# Patient Record
Sex: Female | Born: 1980 | Race: White | Hispanic: No | Marital: Married | Smoking: Never smoker
Health system: Southern US, Community
[De-identification: ages and names within clinical notes are randomized; demographics above are authoritative.]

## PROBLEM LIST (undated history)

## (undated) DIAGNOSIS — J302 Other seasonal allergic rhinitis: Secondary | ICD-10-CM

## (undated) DIAGNOSIS — E538 Deficiency of other specified B group vitamins: Secondary | ICD-10-CM

## (undated) HISTORY — DX: Deficiency of other specified B group vitamins: E53.8

## (undated) HISTORY — DX: Other seasonal allergic rhinitis: J30.2

## (undated) HISTORY — PX: TONGUE SURGERY: SHX810

---

## 2005-10-11 HISTORY — PX: TONSILLECTOMY AND ADENOIDECTOMY: SUR1326

## 2016-01-08 ENCOUNTER — Encounter: Payer: Self-pay | Admitting: Women's Health

## 2016-01-08 ENCOUNTER — Ambulatory Visit (INDEPENDENT_AMBULATORY_CARE_PROVIDER_SITE_OTHER): Payer: BC Managed Care – PPO | Admitting: Women's Health

## 2016-01-08 ENCOUNTER — Other Ambulatory Visit (HOSPITAL_COMMUNITY)
Admission: RE | Admit: 2016-01-08 | Discharge: 2016-01-08 | Disposition: A | Discharge status: Home / Self Care | Payer: BC Managed Care – PPO | Source: Ambulatory Visit | Attending: Obstetrics & Gynecology | Admitting: Obstetrics & Gynecology

## 2016-01-08 VITALS — BP 114/74 | HR 76 | Ht 68.5 in | Wt 259.0 lb

## 2016-01-08 DIAGNOSIS — R7303 Prediabetes: Secondary | ICD-10-CM

## 2016-01-08 DIAGNOSIS — G43009 Migraine without aura, not intractable, without status migrainosus: Secondary | ICD-10-CM | POA: Diagnosis not present

## 2016-01-08 DIAGNOSIS — Z01419 Encounter for gynecological examination (general) (routine) without abnormal findings: Secondary | ICD-10-CM | POA: Insufficient documentation

## 2016-01-08 DIAGNOSIS — Z01411 Encounter for gynecological examination (general) (routine) with abnormal findings: Secondary | ICD-10-CM

## 2016-01-08 DIAGNOSIS — E119 Type 2 diabetes mellitus without complications: Secondary | ICD-10-CM | POA: Insufficient documentation

## 2016-01-08 DIAGNOSIS — Z1151 Encounter for screening for human papillomavirus (HPV): Secondary | ICD-10-CM | POA: Insufficient documentation

## 2016-01-08 DIAGNOSIS — G43909 Migraine, unspecified, not intractable, without status migrainosus: Secondary | ICD-10-CM | POA: Insufficient documentation

## 2016-01-08 DIAGNOSIS — E669 Obesity, unspecified: Secondary | ICD-10-CM | POA: Insufficient documentation

## 2016-01-08 MED ORDER — NORETHIN ACE-ETH ESTRAD-FE 1.5-30 MG-MCG PO TABS: 1.0000 | ORAL_TABLET | Freq: Every day | ORAL | Status: DC

## 2016-01-08 NOTE — Patient Instructions (Signed)
Oral Contraception Use Oral contraceptive pills (OCPs) are medicines taken to prevent pregnancy. OCPs work by preventing the ovaries from releasing eggs. The hormones in OCPs also cause the cervical mucus to thicken, preventing the sperm from entering the uterus. The hormones also cause the uterine lining to become thin, not allowing a fertilized egg to attach to the inside of the uterus. OCPs are highly effective when taken exactly as prescribed. However, OCPs do not prevent sexually transmitted diseases (STDs). Safe sex practices, such as using condoms along with an OCP, can help prevent STDs. Before taking OCPs, you may have a physical exam and Pap test. Your health care provider may also order blood tests if necessary. Your health care provider will make sure you are a good candidate for oral contraception. Discuss with your health care provider the possible side effects of the OCP you may be prescribed. When starting an OCP, it can take 2 to 3 months for the body to adjust to the changes in hormone levels in your body.  HOW TO TAKE ORAL CONTRACEPTIVE PILLS Your health care provider may advise you on how to start taking the first cycle of OCPs. Otherwise, you can:   Start on day 1 of your menstrual period. You will not need any backup contraceptive protection with this start time.   Start on the first Sunday after your menstrual period or the day you get your prescription. In these cases, you will need to use backup contraceptive protection for the first week.   Start the pill at any time of your cycle. If you take the pill within 5 days of the start of your period, you are protected against pregnancy right away. In this case, you will not need a backup form of birth control. If you start at any other time of your menstrual cycle, you will need to use another form of birth control for 7 days. If your OCP is the type called a minipill, it will protect you from pregnancy after taking it for 2 days (48  hours). After you have started taking OCPs:   If you forget to take 1 pill, take it as soon as you remember. Take the next pill at the regular time.   If you miss 2 or more pills, call your health care provider because different pills have different instructions for missed doses. Use backup birth control until your next menstrual period starts.   If you use a 28-day pack that contains inactive pills and you miss 1 of the last 7 pills (pills with no hormones), it will not matter. Throw away the rest of the non-hormone pills and start a new pill pack.  No matter which day you start the OCP, you will always start a new pack on that same day of the week. Have an extra pack of OCPs and a backup contraceptive method available in case you miss some pills or lose your OCP pack.  HOME CARE INSTRUCTIONS   Do not smoke.   Always use a condom to protect against STDs. OCPs do not protect against STDs.   Use a calendar to mark your menstrual period days.   Read the information and directions that came with your OCP. Talk to your health care provider if you have questions.  SEEK MEDICAL CARE IF:   You develop nausea and vomiting.   You have abnormal vaginal discharge or bleeding.   You develop a rash.   You miss your menstrual period.   You are losing   your hair.   You need treatment for mood swings or depression.   You get dizzy when taking the OCP.   You develop acne from taking the OCP.   You become pregnant.  SEEK IMMEDIATE MEDICAL CARE IF:   You develop chest pain.   You develop shortness of breath.   You have an uncontrolled or severe headache.   You develop numbness or slurred speech.   You develop visual problems.   You develop pain, redness, and swelling in the legs.    This information is not intended to replace advice given to you by your health care provider. Make sure you discuss any questions you have with your health care provider.   Document  Released: 10/17/2011 Document Revised: 11/18/2014 Document Reviewed: 04/18/2013 Elsevier Interactive Patient Education 2016 Elsevier Inc.  

## 2016-01-08 NOTE — Progress Notes (Signed)
Patient ID: Natasha Flynn, female   DOB: 1981-02-11, 35 y.o.   MRN: [id number] Subjective:   Natasha Flynn is a 35 y.o. G0 Caucasian female here for a routine well-woman exam.  No LMP recorded. Patient is not currently having periods (Reason: Oral contraceptives).   Does continuous coc's x 3 months, then has period.  Current complaints: none PCP: Dr. Judie Grieve in [place]       Does not desire labs, done by PCP. Last A1C done w/in last pre-diabetic range, working w/ PCP to lose weight/lower A1C- has lost 25lbs Getting married in 18d, does not plan pregnancy anytime soon Needs refill on COCs. Does have migraines- takes topimax- no aura. Does not smoke, no h/o HTN, DVT/PE, CVA, MI, or migraines w/ aura.    Social History: Sexual: heterosexual Marital Status: engaged, getting married in [place]!!! 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  Past Medical History History reviewed. No pertinent past medical history.  Past Surgical History Past Surgical History  Procedure Laterality Date  . Tonsillectomy and adenoidectomy  10/2005  . Tongue surgery      Gynecologic History No obstetric history on file.  No LMP recorded. Patient is not currently having periods (Reason: Oral contraceptives). Contraception: OCP (estrogen/progesterone) Last Pap: 2013. Results were: normal Last mammogram: never. Results were: n/a Last TCS: never  Obstetric History OB History  No data available    Current Medications No current outpatient prescriptions on file prior to visit.   No current facility-administered medications on file prior to visit.    Review of  Systems Patient denies any headaches, blurred vision, shortness of breath, chest pain, abdominal pain, problems with bowel movements, urination, or intercourse.  Objective:  BP 114/74 mmHg  Pulse 76  Ht 5' 8.5" (1.74 m)  Wt 259 lb (117.482 kg)  BMI 38.80 kg/m2 Physical Exam  General:  Well developed, well nourished, no acute distress. She is alert and oriented x3. Skin:  Warm and dry Neck:  Midline trachea, no thyromegaly or nodules Cardiovascular: Regular rate and rhythm, no murmur heard Lungs:  Effort normal, all lung fields clear to auscultation bilaterally Breasts:  No dominant palpable mass, retraction, or nipple discharge Abdomen:  Soft, non tender, no hepatosplenomegaly or masses Pelvic:  External genitalia is normal in appearance.  The vagina is normal in appearance. The cervix is bulbous, no CMT.  Thin prep pap is done w/ HR HPV cotesting. Uterus is felt to be normal size, shape, and contour.  No adnexal masses or tenderness noted. Extremities:  No swelling or varicosities noted Psych:  She has a normal mood and affect  Assessment:   Healthy well-woman exam Pre-diabetes Obesity  Plan:  Continue working on weight loss Refilled COCs x 61yr F/U 58yr for physical, or sooner if needed Mammogram  or sooner if problems Colonoscopy  or sooner if problems  Marge Duncans CNM, [facility] 01/08/2016 4:47 PM

## 2016-01-10 LAB — CYTOLOGY - PAP

## 2016-07-04 ENCOUNTER — Other Ambulatory Visit: Payer: Self-pay | Admitting: Family Medicine

## 2016-07-04 DIAGNOSIS — M79672 Pain in left foot: Secondary | ICD-10-CM

## 2016-07-17 ENCOUNTER — Ambulatory Visit
Admission: RE | Admit: 2016-07-17 | Discharge: 2016-07-17 | Disposition: A | Discharge status: Home / Self Care | Payer: BC Managed Care – PPO | Source: Ambulatory Visit | Attending: Family Medicine | Admitting: Family Medicine

## 2016-07-17 DIAGNOSIS — M79672 Pain in left foot: Secondary | ICD-10-CM

## 2016-10-30 ENCOUNTER — Other Ambulatory Visit: Payer: Self-pay | Admitting: Women's Health

## 2016-11-06 ENCOUNTER — Other Ambulatory Visit: Payer: Self-pay | Admitting: *Deleted

## 2017-01-09 ENCOUNTER — Ambulatory Visit (INDEPENDENT_AMBULATORY_CARE_PROVIDER_SITE_OTHER): Payer: BC Managed Care – PPO | Admitting: Women's Health

## 2017-01-09 ENCOUNTER — Encounter: Payer: Self-pay | Admitting: Women's Health

## 2017-01-09 VITALS — BP 130/90 | HR 76 | Ht 67.0 in | Wt 269.8 lb

## 2017-01-09 DIAGNOSIS — Z01419 Encounter for gynecological examination (general) (routine) without abnormal findings: Secondary | ICD-10-CM

## 2017-01-09 DIAGNOSIS — Z6841 Body Mass Index (BMI) 40.0 and over, adult: Secondary | ICD-10-CM | POA: Diagnosis not present

## 2017-01-09 DIAGNOSIS — Z01411 Encounter for gynecological examination (general) (routine) with abnormal findings: Secondary | ICD-10-CM | POA: Diagnosis not present

## 2017-01-09 DIAGNOSIS — R030 Elevated blood-pressure reading, without diagnosis of hypertension: Secondary | ICD-10-CM

## 2017-01-09 DIAGNOSIS — E669 Obesity, unspecified: Secondary | ICD-10-CM | POA: Diagnosis not present

## 2017-01-09 NOTE — Patient Instructions (Signed)
Keep appointment with your doctor, talk to him about your blood pressure and ask about yearly labs  130/90, 140/100  Come back in a week for a blood pressure check with our nurse

## 2017-01-09 NOTE — Progress Notes (Signed)
Subjective:   Natasha Flynn is a 36 y.o. Caucasian female here for a routine well-woman exam.  Patient's last menstrual period was 11/18/2016.    Current complaints: seeing pcp tomorrow for recurrent cough, hasn't felt well since Nov PCP: Dr. Quillian Quince in [place]       Does not desire labs, will do w/ PCP Needs refills on coc's Denies h/o HTN, states she felt like it would be high today  Social History: Sexual: heterosexual Marital Status: married Living situation: w/ husband Occupation: Pharmacist, hospital at [employer] Tobacco/alcohol: no tobacco, etoh: occ Illicit drugs: no history of illicit drug use  The following portions of the patient's history were reviewed and updated as appropriate: allergies, current medications, past family history, past medical history, past social history, past surgical history and problem list.  Past Medical History Past Medical History:  Diagnosis Date  . Seasonal allergies     Past Surgical History Past Surgical History:  Procedure Laterality Date  . TONGUE SURGERY    . TONSILLECTOMY AND ADENOIDECTOMY  10/2005    Gynecologic History No obstetric history on file.  Patient's last menstrual period was 11/18/2016. Contraception: OCP (estrogen/progesterone) Last Pap: 2017. Results were: normal Last mammogram: never. Results were: n/a Last TCS: never  Obstetric History OB History  No data available    Current Medications Current Outpatient Prescriptions on File Prior to Visit  Medication Sig Dispense Refill  . Alcaftadine (LASTACAFT) 0.25 % SOLN Apply to eye.    Marland Kitchen BLISOVI FE 1.5/30 1.5-30 MG-MCG tablet TAKE 1 TABLET BY MOUTH EVERY DAY 28 tablet 2  . Cyanocobalamin 1000 MCG/ML KIT Inject as directed every 30 (thirty) days.    . cyanocobalamin 500 MCG tablet Take 500 mcg by mouth daily.    . montelukast (SINGULAIR) 10 MG tablet Take 10 mg by mouth at bedtime.    . topiramate (TOPAMAX) 25 MG capsule Take 25 mg by mouth 2 (two) times daily.     No current  facility-administered medications on file prior to visit.     Review of Systems Patient denies any headaches, blurred vision, shortness of breath, chest pain, abdominal pain, problems with bowel movements, urination, or intercourse.  Objective:  BP 130/90   Pulse 76   Ht 5' 7"  (1.702 m)   Wt 269 lb 12.8 oz (122.4 kg)   LMP 11/18/2016   BMI 42.26 kg/m   BP recheck 140/100 Physical Exam  General:  Well developed, well nourished, no acute distress. She is alert and oriented x3. Skin:  Warm and dry Neck:  Midline trachea, no thyromegaly or nodules Cardiovascular: Regular rate and rhythm, no murmur heard Lungs:  Effort normal, all lung fields clear to auscultation bilaterally Breasts:  No dominant palpable mass, retraction, or nipple discharge Abdomen:  Soft, non tender, no hepatosplenomegaly or masses Pelvic:  External genitalia is normal in appearance.  The vagina is normal in appearance. The cervix is bulbous, no CMT.  Thin prep pap is not done . Uterus is felt to be normal size, shape, and contour.  No adnexal masses or tenderness noted. Unable to adequately assess d/t body habitus Extremities:  No swelling or varicosities noted Psych:  She has a normal mood and affect  Assessment:   Healthy well-woman exam Elevated bp Persistent cough Obesity Contraception management  Plan:  Discussed can't refill coc today d/t bp, can return in 1wk for bp check, if normal will rx then, discussed progesin only methods, none of which she wants F/U 1wk for bp check w/ RN,  or sooner if needed Mammogram @36yo  or sooner if problems Colonoscopy @36yo  or sooner if problems Keep appt w/ pcp tomorrow for persistent cough, discuss bp (gave 2 bp's here today to take w/ her), discuss yearly labs  Tawnya Crook CNM, [facility] 01/09/2017 3:40 PM

## 2017-01-16 ENCOUNTER — Other Ambulatory Visit: Payer: Self-pay | Admitting: Women's Health

## 2017-01-16 ENCOUNTER — Ambulatory Visit (INDEPENDENT_AMBULATORY_CARE_PROVIDER_SITE_OTHER): Payer: BC Managed Care – PPO | Admitting: *Deleted

## 2017-01-16 VITALS — BP 126/86

## 2017-01-16 DIAGNOSIS — Z308 Encounter for other contraceptive management: Secondary | ICD-10-CM

## 2017-01-16 DIAGNOSIS — Z0131 Encounter for examination of blood pressure with abnormal findings: Secondary | ICD-10-CM | POA: Diagnosis not present

## 2017-01-16 MED ORDER — NORETHIN ACE-ETH ESTRAD-FE 1.5-30 MG-MCG PO TABS: 1.0000 | ORAL_TABLET | Freq: Every day | ORAL | 11 refills | Status: DC

## 2017-01-16 NOTE — Progress Notes (Signed)
BP check w/ RN good today, 126/86, will refill coc's x 276yr.  Cheral MarkerKimberly R. Dorsey Authement, CNM, [facility] 01/16/2017 5:09 PM

## 2017-01-16 NOTE — Progress Notes (Signed)
Pt here for BP check. BP 126/86. Pt with no complaints.

## 2017-12-16 ENCOUNTER — Other Ambulatory Visit: Payer: Self-pay | Admitting: Women's Health

## 2018-08-06 ENCOUNTER — Encounter: Payer: Self-pay | Admitting: Adult Health

## 2018-08-06 ENCOUNTER — Ambulatory Visit: Payer: BC Managed Care – PPO | Admitting: Adult Health

## 2018-08-06 ENCOUNTER — Encounter (INDEPENDENT_AMBULATORY_CARE_PROVIDER_SITE_OTHER): Payer: Self-pay

## 2018-08-06 VITALS — BP 120/82 | HR 109 | Ht 70.0 in | Wt 261.0 lb

## 2018-08-06 DIAGNOSIS — Z3A01 Less than 8 weeks gestation of pregnancy: Secondary | ICD-10-CM

## 2018-08-06 DIAGNOSIS — Z3201 Encounter for pregnancy test, result positive: Secondary | ICD-10-CM | POA: Diagnosis not present

## 2018-08-06 DIAGNOSIS — N926 Irregular menstruation, unspecified: Secondary | ICD-10-CM | POA: Diagnosis not present

## 2018-08-06 DIAGNOSIS — O3680X0 Pregnancy with inconclusive fetal viability, not applicable or unspecified: Secondary | ICD-10-CM | POA: Insufficient documentation

## 2018-08-06 DIAGNOSIS — O09511 Supervision of elderly primigravida, first trimester: Secondary | ICD-10-CM | POA: Insufficient documentation

## 2018-08-06 LAB — POCT URINE PREGNANCY: PREG TEST UR: POSITIVE — AB

## 2018-08-06 NOTE — Patient Instructions (Signed)
First Trimester of Pregnancy The first trimester of pregnancy is from week 1 until the end of week 13 (months 1 through 3). A week after a sperm fertilizes an egg, the egg will implant on the wall of the uterus. This embryo will begin to develop into a baby. Genes from you and your partner will form the baby. The female genes will determine whether the baby will be a boy or a girl. At 6-8 weeks, the eyes and face will be formed, and the heartbeat can be seen on ultrasound. At the end of 12 weeks, all the baby's organs will be formed. Now that you are pregnant, you will want to do everything you can to have a healthy baby. Two of the most important things are to get good prenatal care and to follow your health care provider's instructions. Prenatal care is all the medical care you receive before the baby's birth. This care will help prevent, find, and treat any problems during the pregnancy and childbirth. Body changes during your first trimester Your body goes through many changes during pregnancy. The changes vary from woman to woman.  You may gain or lose a couple of pounds at first.  You may feel sick to your stomach (nauseous) and you may throw up (vomit). If the vomiting is uncontrollable, call your health care provider.  You may tire easily.  You may develop headaches that can be relieved by medicines. All medicines should be approved by your health care provider.  You may urinate more often. Painful urination may mean you have a bladder infection.  You may develop heartburn as a result of your pregnancy.  You may develop constipation because certain hormones are causing the muscles that push stool through your intestines to slow down.  You may develop hemorrhoids or swollen veins (varicose veins).  Your breasts may begin to grow larger and become tender. Your nipples may stick out more, and the tissue that surrounds them (areola) may become darker.  Your gums may bleed and may be  sensitive to brushing and flossing.  Dark spots or blotches (chloasma, mask of pregnancy) may develop on your face. This will likely fade after the baby is born.  Your menstrual periods will stop.  You may have a loss of appetite.  You may develop cravings for certain kinds of food.  You may have changes in your emotions from day to day, such as being excited to be pregnant or being concerned that something may go wrong with the pregnancy and baby.  You may have more vivid and strange dreams.  You may have changes in your hair. These can include thickening of your hair, rapid growth, and changes in texture. Some women also have hair loss during or after pregnancy, or hair that feels dry or thin. Your hair will most likely return to normal after your baby is born.  What to expect at prenatal visits During a routine prenatal visit:  You will be weighed to make sure you and the baby are growing normally.  Your blood pressure will be taken.  Your abdomen will be measured to track your baby's growth.  The fetal heartbeat will be listened to between weeks 10 and 14 of your pregnancy.  Test results from any previous visits will be discussed.  Your health care provider may ask you:  How you are feeling.  If you are feeling the baby move.  If you have had any abnormal symptoms, such as leaking fluid, bleeding, severe headaches,   or abdominal cramping.  If you are using any tobacco products, including cigarettes, chewing tobacco, and electronic cigarettes.  If you have any questions.  Other tests that may be performed during your first trimester include:  Blood tests to find your blood type and to check for the presence of any previous infections. The tests will also be used to check for low iron levels (anemia) and protein on red blood cells (Rh antibodies). Depending on your risk factors, or if you previously had diabetes during pregnancy, you may have tests to check for high blood  sugar that affects pregnant women (gestational diabetes).  Urine tests to check for infections, diabetes, or protein in the urine.  An ultrasound to confirm the proper growth and development of the baby.  Fetal screens for spinal cord problems (spina bifida) and Down syndrome.  HIV (human immunodeficiency virus) testing. Routine prenatal testing includes screening for HIV, unless you choose not to have this test.  You may need other tests to make sure you and the baby are doing well.  Follow these instructions at home: Medicines  Follow your health care provider's instructions regarding medicine use. Specific medicines may be either safe or unsafe to take during pregnancy.  Take a prenatal vitamin that contains at least 600 micrograms (mcg) of folic acid.  If you develop constipation, try taking a stool softener if your health care provider approves. Eating and drinking  Eat a balanced diet that includes fresh fruits and vegetables, whole grains, good sources of protein such as meat, eggs, or tofu, and low-fat dairy. Your health care provider will help you determine the amount of weight gain that is right for you.  Avoid raw meat and uncooked cheese. These carry germs that can cause birth defects in the baby.  Eating four or five small meals rather than three large meals a day may help relieve nausea and vomiting. If you start to feel nauseous, eating a few soda crackers can be helpful. Drinking liquids between meals, instead of during meals, also seems to help ease nausea and vomiting.  Limit foods that are high in fat and processed sugars, such as fried and sweet foods.  To prevent constipation: ? Eat foods that are high in fiber, such as fresh fruits and vegetables, whole grains, and beans. ? Drink enough fluid to keep your urine clear or pale yellow. Activity  Exercise only as directed by your health care provider. Most women can continue their usual exercise routine during  pregnancy. Try to exercise for 30 minutes at least 5 days a week. Exercising will help you: ? Control your weight. ? Stay in shape. ? Be prepared for labor and delivery.  Experiencing pain or cramping in the lower abdomen or lower back is a good sign that you should stop exercising. Check with your health care provider before continuing with normal exercises.  Try to avoid standing for long periods of time. Move your legs often if you must stand in one place for a long time.  Avoid heavy lifting.  Wear low-heeled shoes and practice good posture.  You may continue to have sex unless your health care provider tells you not to. Relieving pain and discomfort  Wear a good support bra to relieve breast tenderness.  Take warm sitz baths to soothe any pain or discomfort caused by hemorrhoids. Use hemorrhoid cream if your health care provider approves.  Rest with your legs elevated if you have leg cramps or low back pain.  If you develop   varicose veins in your legs, wear support hose. Elevate your feet for 15 minutes, 3-4 times a day. Limit salt in your diet. Prenatal care  Schedule your prenatal visits by the twelfth week of pregnancy. They are usually scheduled monthly at first, then more often in the last 2 months before delivery.  Write down your questions. Take them to your prenatal visits.  Keep all your prenatal visits as told by your health care provider. This is important. Safety  Wear your seat belt at all times when driving.  Make a list of emergency phone numbers, including numbers for family, friends, the hospital, and police and fire departments. General instructions  Ask your health care provider for a referral to a local prenatal education class. Begin classes no later than the beginning of month 6 of your pregnancy.  Ask for help if you have counseling or nutritional needs during pregnancy. Your health care provider can offer advice or refer you to specialists for help  with various needs.  Do not use hot tubs, steam rooms, or saunas.  Do not douche or use tampons or scented sanitary pads.  Do not cross your legs for long periods of time.  Avoid cat litter boxes and soil used by cats. These carry germs that can cause birth defects in the baby and possibly loss of the fetus by miscarriage or stillbirth.  Avoid all smoking, herbs, alcohol, and medicines not prescribed by your health care provider. Chemicals in these products affect the formation and growth of the baby.  Do not use any products that contain nicotine or tobacco, such as cigarettes and e-cigarettes. If you need help quitting, ask your health care provider. You may receive counseling support and other resources to help you quit.  Schedule a dentist appointment. At home, brush your teeth with a soft toothbrush and be gentle when you floss. Contact a health care provider if:  You have dizziness.  You have mild pelvic cramps, pelvic pressure, or nagging pain in the abdominal area.  You have persistent nausea, vomiting, or diarrhea.  You have a bad smelling vaginal discharge.  You have pain when you urinate.  You notice increased swelling in your face, hands, legs, or ankles.  You are exposed to fifth disease or chickenpox.  You are exposed to German measles (rubella) and have never had it. Get help right away if:  You have a fever.  You are leaking fluid from your vagina.  You have spotting or bleeding from your vagina.  You have severe abdominal cramping or pain.  You have rapid weight gain or loss.  You vomit blood or material that looks like coffee grounds.  You develop a severe headache.  You have shortness of breath.  You have any kind of trauma, such as from a fall or a car accident. Summary  The first trimester of pregnancy is from week 1 until the end of week 13 (months 1 through 3).  Your body goes through many changes during pregnancy. The changes vary from  woman to woman.  You will have routine prenatal visits. During those visits, your health care provider will examine you, discuss any test results you may have, and talk with you about how you are feeling. This information is not intended to replace advice given to you by your health care provider. Make sure you discuss any questions you have with your health care provider. Document Released: 10/22/2001 Document Revised: 10/09/2016 Document Reviewed: 10/09/2016 Elsevier Interactive Patient Education  2018 Elsevier   Inc.  

## 2018-08-06 NOTE — Progress Notes (Signed)
  Subjective:     Patient ID: Natasha Flynn, female   DOB: 05-10-1981, 37 y.o.   MRN: [id number]  HPI Natasha Flynn is a 37 year old white female, married in for UPT, has missed a period and had +HPT x 2.  Review of Systems +missed period, with +HPT x 2  +cramps at times Reviewed past medical,surgical, social and family history. Reviewed medications and allergies.     Objective:   Physical Exam BP 120/82 (BP Location: Right Arm, Patient Position: Sitting, Cuff Size: Normal)   Pulse (!) 109   Ht 5\' 10"  (1.778 m)   Wt 261 lb (118.4 kg)   LMP 06/20/2018 (Exact Date)   BMI 37.45 kg/m UPT +,a bout 6+5 weeks by LMP with EDD 03/27/19.Skin warm and dry. Neck: mid line trachea, normal thyroid, good ROM, no lymphadenopathy noted. Lungs: clear to ausculation bilaterally. Cardiovascular: regular rate and rhythm.abdomen is soft and non tender. PHQ 2 score 0.    Assessment:     1. Pregnancy test positive   2. Less than [redacted] weeks gestation of pregnancy   3. Encounter to determine fetal viability of pregnancy, single or unspecified fetus   4. AMA (advanced maternal age) primigravida 37+, first trimester       Plan:     Continue PNV OK to take B12 Dating [place] 10/4 at [facility] at 10:30 am Review handout on First trimester and by [facility]

## 2018-08-14 ENCOUNTER — Ambulatory Visit (HOSPITAL_COMMUNITY)
Admission: RE | Admit: 2018-08-14 | Discharge: 2018-08-14 | Disposition: A | Discharge status: Home / Self Care | Payer: BC Managed Care – PPO | Source: Ambulatory Visit | Attending: Adult Health | Admitting: Adult Health

## 2018-08-14 DIAGNOSIS — O3680X0 Pregnancy with inconclusive fetal viability, not applicable or unspecified: Secondary | ICD-10-CM | POA: Diagnosis present

## 2018-08-14 DIAGNOSIS — Z3A01 Less than 8 weeks gestation of pregnancy: Secondary | ICD-10-CM | POA: Diagnosis not present

## 2018-08-28 ENCOUNTER — Encounter: Payer: Self-pay | Admitting: Women's Health

## 2018-08-28 ENCOUNTER — Ambulatory Visit (INDEPENDENT_AMBULATORY_CARE_PROVIDER_SITE_OTHER): Payer: BC Managed Care – PPO | Admitting: Women's Health

## 2018-08-28 ENCOUNTER — Ambulatory Visit: Payer: BC Managed Care – PPO | Admitting: *Deleted

## 2018-08-28 VITALS — BP 125/79 | HR 76 | Wt 259.0 lb

## 2018-08-28 DIAGNOSIS — Z1389 Encounter for screening for other disorder: Secondary | ICD-10-CM | POA: Diagnosis not present

## 2018-08-28 DIAGNOSIS — L292 Pruritus vulvae: Secondary | ICD-10-CM

## 2018-08-28 DIAGNOSIS — O9989 Other specified diseases and conditions complicating pregnancy, childbirth and the puerperium: Secondary | ICD-10-CM

## 2018-08-28 DIAGNOSIS — O0990 Supervision of high risk pregnancy, unspecified, unspecified trimester: Secondary | ICD-10-CM | POA: Insufficient documentation

## 2018-08-28 DIAGNOSIS — Z3682 Encounter for antenatal screening for nuchal translucency: Secondary | ICD-10-CM

## 2018-08-28 DIAGNOSIS — E559 Vitamin D deficiency, unspecified: Secondary | ICD-10-CM | POA: Insufficient documentation

## 2018-08-28 DIAGNOSIS — Z3A08 8 weeks gestation of pregnancy: Secondary | ICD-10-CM | POA: Diagnosis not present

## 2018-08-28 DIAGNOSIS — Z331 Pregnant state, incidental: Secondary | ICD-10-CM | POA: Diagnosis not present

## 2018-08-28 DIAGNOSIS — Z3401 Encounter for supervision of normal first pregnancy, first trimester: Secondary | ICD-10-CM

## 2018-08-28 LAB — POCT URINALYSIS DIPSTICK OB
LEUKOCYTES UA: NEGATIVE
NITRITE UA: NEGATIVE
PROTEIN: NEGATIVE
RBC UA: NEGATIVE

## 2018-08-28 LAB — POCT WET PREP (WET MOUNT)
Clue Cells Wet Prep Whiff POC: NEGATIVE
TRICHOMONAS WET PREP HPF POC: ABSENT

## 2018-08-28 NOTE — Patient Instructions (Addendum)
Natasha Flynn, I greatly value your feedback.  If you receive a survey following your visit with [place] today, we appreciate you taking the time to fill it out.  Thanks, Joellyn Haff, CNM, WHNP-BC  You will have your sugar test next visit.  Please do not eat or drink anything after midnight the night before you come, not even water.  You will be here for at least two hours.      Nausea & Vomiting  Have saltine crackers or pretzels by your bed and eat a few bites before you raise your head out of bed in the morning  Eat small frequent meals throughout the day instead of large meals  Drink plenty of fluids throughout the day to stay hydrated, just don't drink a lot of fluids with your meals.  This can make your stomach fill up faster making you feel sick  Do not brush your teeth right after you eat  Products with real ginger are good for nausea, like ginger ale and ginger hard candy Make sure it says made with real ginger!  Sucking on sour candy like lemon heads is also good for nausea  If your prenatal vitamins make you nauseated, take them at night so you will sleep through the nausea  Sea Bands  If you feel like you need medicine for the nausea & vomiting please let us know  If you are unable to keep any fluids or food down please let us know   Constipation  Drink plenty of fluid, preferably water, throughout the day  Eat foods high in fiber such as fruits, vegetables, and grains  Exercise, such as walking, is a good way to keep your bowels regular  Drink warm fluids, especially warm prune juice, or decaf coffee  Eat a 1/2 cup of real oatmeal (not instant), 1/2 cup applesauce, and 1/2-1 cup warm prune juice every day  If needed, you may take Colace (docusate sodium) stool softener once or twice a day to help keep the stool soft. If you are pregnant, wait until you are out of your first trimester (12-14 weeks of pregnancy)  If you still are having problems with constipation, you  may take Miralax once daily as needed to help keep your bowels regular.  If you are pregnant, wait until you are out of your first trimester (12-14 weeks of pregnancy)   First Trimester of Pregnancy The first trimester of pregnancy is from week 1 until the end of week 12 (months 1 through 3). A week after a sperm fertilizes an egg, the egg will implant on the wall of the uterus. This embryo will begin to develop into a baby. Genes from you and your partner are forming the baby. The female genes determine whether the baby is a boy or a girl. At 6-8 weeks, the eyes and face are formed, and the heartbeat can be seen on ultrasound. At the end of 12 weeks, all the baby's organs are formed.  Now that you are pregnant, you will want to do everything you can to have a healthy baby. Two of the most important things are to get good prenatal care and to follow your health care provider's instructions. Prenatal care is all the medical care you receive before the baby's birth. This care will help prevent, find, and treat any problems during the pregnancy and childbirth. BODY CHANGES Your body goes through many changes during pregnancy. The changes vary from woman to woman.   You may gain or lose  a couple of pounds at first.  You may feel sick to your stomach (nauseous) and throw up (vomit). If the vomiting is uncontrollable, call your health care provider.  You may tire easily.  You may develop headaches that can be relieved by medicines approved by your health care provider.  You may urinate more often. Painful urination may mean you have a bladder infection.  You may develop heartburn as a result of your pregnancy.  You may develop constipation because certain hormones are causing the muscles that push waste through your intestines to slow down.  You may develop hemorrhoids or swollen, bulging veins (varicose veins).  Your breasts may begin to grow larger and become tender. Your nipples may stick out  more, and the tissue that surrounds them (areola) may become darker.  Your gums may bleed and may be sensitive to brushing and flossing.  Dark spots or blotches (chloasma, mask of pregnancy) may develop on your face. This will likely fade after the baby is born.  Your menstrual periods will stop.  You may have a loss of appetite.  You may develop cravings for certain kinds of food.  You may have changes in your emotions from day to day, such as being excited to be pregnant or being concerned that something may go wrong with the pregnancy and baby.  You may have more vivid and strange dreams.  You may have changes in your hair. These can include thickening of your hair, rapid growth, and changes in texture. Some women also have hair loss during or after pregnancy, or hair that feels dry or thin. Your hair will most likely return to normal after your baby is born. WHAT TO EXPECT AT YOUR PRENATAL VISITS During a routine prenatal visit:  You will be weighed to make sure you and the baby are growing normally.  Your blood pressure will be taken.  Your abdomen will be measured to track your baby's growth.  The fetal heartbeat will be listened to starting around week 10 or 12 of your pregnancy.  Test results from any previous visits will be discussed. Your health care provider may ask you:  How you are feeling.  If you are feeling the baby move.  If you have had any abnormal symptoms, such as leaking fluid, bleeding, severe headaches, or abdominal cramping.  If you have any questions. Other tests that may be performed during your first trimester include:  Blood tests to find your blood type and to check for the presence of any previous infections. They will also be used to check for low iron levels (anemia) and Rh antibodies. Later in the pregnancy, blood tests for diabetes will be done along with other tests if problems develop.  Urine tests to check for infections, diabetes, or  protein in the urine.  An ultrasound to confirm the proper growth and development of the baby.  An amniocentesis to check for possible genetic problems.  Fetal screens for spina bifida and Down syndrome.  You may need other tests to make sure you and the baby are doing well. HOME CARE INSTRUCTIONS  Medicines  Follow your health care provider's instructions regarding medicine use. Specific medicines may be either safe or unsafe to take during pregnancy.  Take your prenatal vitamins as directed.  If you develop constipation, try taking a stool softener if your health care provider approves. Diet  Eat regular, well-balanced meals. Choose a variety of foods, such as meat or vegetable-based protein, fish, milk and low-fat dairy  products, vegetables, fruits, and whole grain breads and cereals. Your health care provider will help you determine the amount of weight gain that is right for you.  Avoid raw meat and uncooked cheese. These carry germs that can cause birth defects in the baby.  Eating four or five small meals rather than three large meals a day may help relieve nausea and vomiting. If you start to feel nauseous, eating a few soda crackers can be helpful. Drinking liquids between meals instead of during meals also seems to help nausea and vomiting.  If you develop constipation, eat more high-fiber foods, such as fresh vegetables or fruit and whole grains. Drink enough fluids to keep your urine clear or pale yellow. Activity and Exercise  Exercise only as directed by your health care provider. Exercising will help you:  Control your weight.  Stay in shape.  Be prepared for labor and delivery.  Experiencing pain or cramping in the lower abdomen or low back is a good sign that you should stop exercising. Check with your health care provider before continuing normal exercises.  Try to avoid standing for long periods of time. Move your legs often if you must stand in one place for  a long time.  Avoid heavy lifting.  Wear low-heeled shoes, and practice good posture.  You may continue to have sex unless your health care provider directs you otherwise. Relief of Pain or Discomfort  Wear a good support bra for breast tenderness.   Take warm sitz baths to soothe any pain or discomfort caused by hemorrhoids. Use hemorrhoid cream if your health care provider approves.   Rest with your legs elevated if you have leg cramps or low back pain.  If you develop varicose veins in your legs, wear support hose. Elevate your feet for 15 minutes, 3-4 times a day. Limit salt in your diet. Prenatal Care  Schedule your prenatal visits by the twelfth week of pregnancy. They are usually scheduled monthly at first, then more often in the last 2 months before delivery.  Write down your questions. Take them to your prenatal visits.  Keep all your prenatal visits as directed by your health care provider. Safety  Wear your seat belt at all times when driving.  Make a list of emergency phone numbers, including numbers for family, friends, the hospital, and police and fire departments. General Tips  Ask your health care provider for a referral to a local prenatal education class. Begin classes no later than at the beginning of month 6 of your pregnancy.  Ask for help if you have counseling or nutritional needs during pregnancy. Your health care provider can offer advice or refer you to specialists for help with various needs.  Do not use hot tubs, steam rooms, or saunas.  Do not douche or use tampons or scented sanitary pads.  Do not cross your legs for long periods of time.  Avoid cat litter boxes and soil used by cats. These carry germs that can cause birth defects in the baby and possibly loss of the fetus by miscarriage or stillbirth.  Avoid all smoking, herbs, alcohol, and medicines not prescribed by your health care provider. Chemicals in these affect the formation and  growth of the baby.  Schedule a dentist appointment. At home, brush your teeth with a soft toothbrush and be gentle when you floss. SEEK MEDICAL CARE IF:   You have dizziness.  You have mild pelvic cramps, pelvic pressure, or nagging pain in the abdominal area.  You have persistent nausea, vomiting, or diarrhea.  You have a bad smelling vaginal discharge.  You have pain with urination.  You notice increased swelling in your face, hands, legs, or ankles. SEEK IMMEDIATE MEDICAL CARE IF:   You have a fever.  You are leaking fluid from your vagina.  You have spotting or bleeding from your vagina.  You have severe abdominal cramping or pain.  You have rapid weight gain or loss.  You vomit blood or material that looks like coffee grounds.  You are exposed to [place] measles and have never had them.  You are exposed to fifth disease or chickenpox.  You develop a severe headache.  You have shortness of breath.  You have any kind of trauma, such as from a fall or a car accident. Document Released: 10/22/2001 Document Revised: 03/14/2014 Document Reviewed: 09/07/2013 [facility] Patient Information 2015 [place], [place]. This information is not intended to replace advice given to you by your health care provider. Make sure you discuss any questions you have with your health care provider.

## 2018-08-28 NOTE — Progress Notes (Signed)
INITIAL OBSTETRICAL VISIT Patient name: Natasha Flynn MRN [id number]  Date of birth: Apr 02, 1981 Chief Complaint:   Initial Prenatal Visit  History of Present Illness:   Natasha Flynn is a 37 y.o. G1P0 Caucasian female at [redacted]w[redacted]d by 6wk u/s, with an Estimated Date of Delivery: 04/05/19 being seen today for her initial obstetrical visit.   Her obstetrical history is significant for primigravida.   Today she reports recurrent yeast infections, monthly prior to pregnancy, now has had 2-3 since +PT. Uses otc monistat 1night ovule, usually helps, still feels a little itchy on vulva. No abnormal d/c or odor.  3+glucosuria, ate cheesy eggs and toast w/ jelly this am. Had A1C in pre-diabetic range w/ PCP in 2016, hasn't had rechecked since.  Patient's last menstrual period was 06/20/2018 (exact date). Last pap 01/08/16. Results were: normal Review of Systems:   Pertinent items are noted in HPI Denies cramping/contractions, leakage of fluid, vaginal bleeding, abnormal vaginal discharge w/ itching/odor/irritation, headaches, visual changes, shortness of breath, chest pain, abdominal pain, severe nausea/vomiting, or problems with urination or bowel movements unless otherwise stated above.  Pertinent History Reviewed:  Reviewed past medical,surgical, social, obstetrical and family history.  Reviewed problem list, medications and allergies. OB History  Gravida Para Term Preterm AB Living  1            SAB TAB Ectopic Multiple Live Births               # Outcome Date GA Lbr Len/2nd Weight Sex Delivery Anes PTL Lv  1 Current            Physical Assessment:   Vitals:   08/28/18 1015  BP: 125/79  Pulse: 76  Weight: 259 lb (117.5 kg)  Body mass index is 37.16 kg/m.       Physical Examination:  General appearance - well appearing, and in no distress  Mental status - alert, oriented to person, place, and time  Psych:  She has a normal mood and affect  Skin - warm and dry, normal color, no suspicious  lesions noted  Chest - effort normal, all lung fields clear to auscultation bilaterally  Heart - normal rate and regular rhythm  Abdomen - soft, nontender  Extremities:  No swelling or varicosities noted  Thin prep pap is not done  Fetal Heart Rate (bpm): 177 u/s via informal transvaginal u/s  Results for orders placed or performed in visit on 08/28/18 (from the past 24 hour(s))  POC Urinalysis Dipstick OB   Collection Time: 08/28/18 10:23 AM  Result Value Ref Range   Color, UA     Clarity, UA     Glucose, UA Large (3+) (A) Negative   Bilirubin, UA     Ketones, UA trace    Spec Grav, UA     Blood, UA neg    pH, UA     POC Protein UA Negative Negative, Trace   Urobilinogen, UA     Nitrite, UA neg    Leukocytes, UA Negative Negative   Appearance     Odor    POCT Wet Prep Natasha Flynn)   Collection Time: 08/28/18 11:52 AM  Result Value Ref Range   Source Wet Prep POC vaginal    WBC, Wet Prep HPF POC few    Bacteria Wet Prep HPF POC Few Few   BACTERIA WET PREP MORPHOLOGY POC     Clue Cells Wet Prep HPF POC None None   Clue Cells Wet Prep Whiff POC Negative  Whiff    Yeast Wet Prep HPF POC None None   KOH Wet Prep POC     Trichomonas Wet Prep HPF POC Absent Absent    Assessment & Plan:  1) Low-Risk Pregnancy G1P0 at [redacted]w[redacted]d with an Estimated Date of Delivery: 04/05/19   2) Initial OB visit  3) 3+glucosuria> will get early 2hrGTT  4) Recurrent yeast infection> could be r/t possible DM, no current infection, if gets again use otc monistat 7, keep track  Meds: No orders of the defined types were placed in this encounter.   Initial labs obtained Continue prenatal vitamins Reviewed n/v relief measures and warning s/s to report Reviewed recommended weight gain based on pre-gravid BMI Encouraged well-balanced diet Genetic Screening discussed Integrated Screen: requested Cystic fibrosis screening discussed declined Ultrasound discussed; fetal survey: requested CCNC  completed>not applying for preg mcaid Declined flu shot. She was advised that the flu shot is recommended during pregnancy to help protect her and her baby. We discussed that it is an inactivated vaccine-so side effects are minimal, it is considered safe to receive during any trimester, and pregnant women are at a higher risk of developing potential complications from the flu, including death. She was given printed information from the CDC regarding the flu shot and the flu.    Follow-up: Return for asap sugar test (no visit), then 11/18 for LROB and , US:NT+1stIT.   Orders Placed This Encounter  Procedures  . GC/Chlamydia Probe Amp  . Urine Culture  . US Fetal Nuchal Translucency Measurement  . Urinalysis, Routine w reflex microscopic  . Obstetric Panel, Including HIV  . Pain Management Screening Profile (10S)  . POC Urinalysis Dipstick OB  . POCT Wet Prep [facility])    Cheral Marker CNM, [facility] 08/28/2018 11:54 AM

## 2018-08-29 LAB — URINALYSIS, ROUTINE W REFLEX MICROSCOPIC
Bilirubin, UA: NEGATIVE
Leukocytes, UA: NEGATIVE
NITRITE UA: NEGATIVE
PH UA: 5 (ref 5.0–7.5)
Protein, UA: NEGATIVE
RBC, UA: NEGATIVE
SPEC GRAV UA: 1.029 (ref 1.005–1.030)
Urobilinogen, Ur: 0.2 mg/dL (ref 0.2–1.0)

## 2018-08-29 LAB — OBSTETRIC PANEL, INCLUDING HIV
ANTIBODY SCREEN: NEGATIVE
BASOS: 0 %
Basophils Absolute: 0.1 10*3/uL (ref 0.0–0.2)
EOS (ABSOLUTE): 0.1 10*3/uL (ref 0.0–0.4)
EOS: 1 %
HEMATOCRIT: 40.1 % (ref 34.0–46.6)
HEMOGLOBIN: 13.8 g/dL (ref 11.1–15.9)
HEP B S AG: NEGATIVE
HIV Screen 4th Generation wRfx: NONREACTIVE
IMMATURE GRANS (ABS): 0 10*3/uL (ref 0.0–0.1)
IMMATURE GRANULOCYTES: 0 %
LYMPHS: 30 %
Lymphocytes Absolute: 3.3 10*3/uL — ABNORMAL HIGH (ref 0.7–3.1)
MCH: 29.3 pg (ref 26.6–33.0)
MCHC: 34.4 g/dL (ref 31.5–35.7)
MCV: 85 fL (ref 79–97)
MONOS ABS: 0.7 10*3/uL (ref 0.1–0.9)
Monocytes: 6 %
NEUTROS PCT: 63 %
Neutrophils Absolute: 7 10*3/uL (ref 1.4–7.0)
Platelets: 355 10*3/uL (ref 150–450)
RBC: 4.71 x10E6/uL (ref 3.77–5.28)
RDW: 13.4 % (ref 12.3–15.4)
RH TYPE: POSITIVE
RPR: NONREACTIVE
Rubella Antibodies, IGG: 1.47 index (ref >0.99)
WBC: 11.2 10*3/uL — ABNORMAL HIGH (ref 3.4–10.8)

## 2018-08-29 LAB — MED LIST OPTION NOT SELECTED

## 2018-08-30 LAB — GC/CHLAMYDIA PROBE AMP
CHLAMYDIA, DNA PROBE: NEGATIVE
NEISSERIA GONORRHOEAE BY PCR: NEGATIVE

## 2018-08-30 LAB — URINE CULTURE

## 2018-08-31 LAB — PMP SCREEN PROFILE (10S), URINE
Amphetamine Scrn, Ur: NEGATIVE ng/mL
BARBITURATE SCREEN URINE: NEGATIVE ng/mL
BENZODIAZEPINE SCREEN, URINE: NEGATIVE ng/mL
CANNABINOIDS UR QL SCN: NEGATIVE ng/mL
CREATININE(CRT), U: 156.2 mg/dL (ref 20.0–300.0)
Cocaine (Metab) Scrn, Ur: NEGATIVE ng/mL
Methadone Screen, Urine: NEGATIVE ng/mL
OXYCODONE+OXYMORPHONE UR QL SCN: NEGATIVE ng/mL
Opiate Scrn, Ur: NEGATIVE ng/mL
PH UR, DRUG SCRN: 5.6 (ref 4.5–8.9)
Phencyclidine Qn, Ur: NEGATIVE ng/mL
Propoxyphene Scrn, Ur: NEGATIVE ng/mL

## 2018-09-04 ENCOUNTER — Other Ambulatory Visit: Payer: BC Managed Care – PPO

## 2018-09-04 DIAGNOSIS — R7303 Prediabetes: Secondary | ICD-10-CM

## 2018-09-04 DIAGNOSIS — Z131 Encounter for screening for diabetes mellitus: Secondary | ICD-10-CM

## 2018-09-04 DIAGNOSIS — Z3401 Encounter for supervision of normal first pregnancy, first trimester: Secondary | ICD-10-CM

## 2018-09-05 LAB — GLUCOSE TOLERANCE, 2 HOURS W/ 1HR
GLUCOSE, 1 HOUR: 305 mg/dL — AB (ref 65–179)
Glucose, 2 hour: 308 mg/dL — ABNORMAL HIGH (ref 65–152)
Glucose, Fasting: 195 mg/dL — ABNORMAL HIGH (ref 65–91)

## 2018-09-07 ENCOUNTER — Telehealth: Payer: Self-pay | Admitting: *Deleted

## 2018-09-07 ENCOUNTER — Encounter: Payer: Self-pay | Admitting: Women's Health

## 2018-09-07 ENCOUNTER — Other Ambulatory Visit: Payer: Self-pay | Admitting: Women's Health

## 2018-09-07 ENCOUNTER — Telehealth: Payer: Self-pay | Admitting: Women's Health

## 2018-09-07 DIAGNOSIS — O24119 Pre-existing diabetes mellitus, type 2, in pregnancy, unspecified trimester: Secondary | ICD-10-CM

## 2018-09-07 DIAGNOSIS — O24111 Pre-existing diabetes mellitus, type 2, in pregnancy, first trimester: Secondary | ICD-10-CM

## 2018-09-07 LAB — SPECIMEN STATUS REPORT

## 2018-09-07 MED ORDER — METFORMIN HCL 500 MG PO TABS: 500.0000 mg | ORAL_TABLET | Freq: Two times a day (BID) | ORAL | 6 refills | Status: DC

## 2018-09-07 NOTE — Telephone Encounter (Signed)
Called pt, notified of dx of overt T2DM. Referral to dietician placed. Supplies called into her pharmacy. Discussed QID testing/goals. Rx metformin 500mg  BID. Start checking QID sugars now, send me a picture via mychart in 1wk. Will see if lab can add A1C to labs drawn 3d ago, if not, will have pt come in for one.  Cheral Marker, CNM, [facility] 09/07/2018 1:59 PM

## 2018-09-07 NOTE — Telephone Encounter (Signed)
LMOVM that she will need labs as they were not able to add to previous lab.

## 2018-09-17 ENCOUNTER — Other Ambulatory Visit: Payer: Self-pay | Admitting: Women's Health

## 2018-09-17 DIAGNOSIS — O24111 Pre-existing diabetes mellitus, type 2, in pregnancy, first trimester: Secondary | ICD-10-CM

## 2018-09-17 MED ORDER — METFORMIN HCL 1000 MG PO TABS: 1000.0000 mg | ORAL_TABLET | Freq: Two times a day (BID) | ORAL | 6 refills | Status: AC

## 2018-09-18 ENCOUNTER — Encounter: Payer: Self-pay | Admitting: *Deleted

## 2018-09-21 ENCOUNTER — Other Ambulatory Visit: Payer: BC Managed Care – PPO

## 2018-09-21 DIAGNOSIS — E119 Type 2 diabetes mellitus without complications: Secondary | ICD-10-CM

## 2018-09-21 DIAGNOSIS — Z3A12 12 weeks gestation of pregnancy: Secondary | ICD-10-CM

## 2018-09-21 DIAGNOSIS — Z3401 Encounter for supervision of normal first pregnancy, first trimester: Secondary | ICD-10-CM

## 2018-09-22 LAB — HEMOGLOBIN A1C
ESTIMATED AVERAGE GLUCOSE: 212 mg/dL
HEMOGLOBIN A1C: 9 % — AB (ref 4.8–5.6)

## 2018-09-25 ENCOUNTER — Other Ambulatory Visit: Payer: Self-pay | Admitting: Women's Health

## 2018-09-25 ENCOUNTER — Ambulatory Visit (INDEPENDENT_AMBULATORY_CARE_PROVIDER_SITE_OTHER): Payer: BC Managed Care – PPO

## 2018-09-25 ENCOUNTER — Ambulatory Visit (INDEPENDENT_AMBULATORY_CARE_PROVIDER_SITE_OTHER): Payer: BC Managed Care – PPO | Admitting: Women's Health

## 2018-09-25 ENCOUNTER — Encounter: Payer: Self-pay | Admitting: Women's Health

## 2018-09-25 VITALS — BP 134/89 | HR 92 | Wt 256.0 lb

## 2018-09-25 DIAGNOSIS — O3680X0 Pregnancy with inconclusive fetal viability, not applicable or unspecified: Secondary | ICD-10-CM

## 2018-09-25 DIAGNOSIS — Z3682 Encounter for antenatal screening for nuchal translucency: Secondary | ICD-10-CM

## 2018-09-25 DIAGNOSIS — O021 Missed abortion: Secondary | ICD-10-CM

## 2018-09-25 DIAGNOSIS — Z3401 Encounter for supervision of normal first pregnancy, first trimester: Secondary | ICD-10-CM

## 2018-09-25 DIAGNOSIS — E1165 Type 2 diabetes mellitus with hyperglycemia: Secondary | ICD-10-CM | POA: Diagnosis not present

## 2018-09-25 MED ORDER — HYDROCODONE-ACETAMINOPHEN 5-325 MG PO TABS: 1.0000 | ORAL_TABLET | Freq: Four times a day (QID) | ORAL | 0 refills | Status: AC | PRN

## 2018-09-25 MED ORDER — MISOPROSTOL 200 MCG PO TABS: 800.0000 ug | ORAL_TABLET | Freq: Once | ORAL | 1 refills | Status: AC

## 2018-09-25 NOTE — Progress Notes (Signed)
GYN VISIT Patient name: Natasha Flynn Ketchem MRN [id number]  Date of birth: 1981-02-24 Chief Complaint:   discuss US  History of Present Illness:   Natasha Flynn Zumbro is a 37 y.o. G1P0 Caucasian female at 6465w4d by 6wk u/s, being seen today for initially for OB visit w/ 1st NT/IT, however no FCA on today's u/s. CRL c/w 6960w4d. Denies cramping, bleeding. Dx w/ overt undx T2DM @ 9wks w/ 2hr GTT 195/605/308, A1C 9.0. Was started on metformin 500mg  BID on 10/28, increased to 1,000mg  on 11/7. She was scheduled to meet with the dietician next week. She has already changed her diet, reduced carbs, started walking daily, and sugars still not well controlled. Reports FBS 117-218, 2hr PP 43-281 (all >120 but the 43), states she did not feel symptomatic w/ 43, ate hershey kiss to bring up and checked sugar 15min later and was 230s- so wonder if 43 was error. She has strong family h/o DM. Her husband is also diabetic. She is interested in referral to endocrinologist. 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 Patient's last menstrual period was 06/20/2018 (exact date). Review of Systems:   Pertinent items are noted in HPI Denies fever/chills, dizziness, headaches, visual disturbances, fatigue, shortness of breath, chest pain, abdominal pain, vomiting, abnormal vaginal discharge/itching/odor/irritation, problems with periods, bowel movements, urination, or intercourse unless otherwise stated above.  Pertinent History Reviewed:  Reviewed past medical,surgical, social, obstetrical and family history.  Reviewed problem list, medications and allergies. Physical Assessment:   Vitals:   09/25/18 0858  BP: 134/89  Pulse: 92  Weight: 256 lb (116.1 kg)  Body mass index is 36.73 kg/m.       Physical Examination:   General appearance: alert, well appearing, and in no distress  Mental status: alert, oriented to person, place, and time  Skin: warm & dry    Cardiovascular: normal heart rate noted  Respiratory: normal respiratory effort, no distress  Abdomen: soft, non-tender   Pelvic: examination not indicated  Extremities: no edema   Today's U/S:  US 9+4 wks single IUP,no fht,CRL 28.82 mm,normal ovaries bilat,GS 56.5 mm,discussed w/Kim  No results found for this or any previous visit (from the past 24 hour(s)).  Assessment & Plan:  1) Missed Ab>165w4d by 6wk u/s, today's CRL c/w 5660w4d w/o FCA. Discussed options of expectant management vs. cytotec vs. D&C. Discussed what to expect with miscarriage as well as warning s/s to report, reasons to seek care. Printed information given about all options. Pt prefers cytotec over the weekend. Rx hydrocodone #10. Will check BHCG today. She is O+. F/U in 1 week.   2) Uncontrolled T2DM> currently on metformin 1,000mg  BID. New dx at 9wks w/ A1C 9.0. LHE in to discuss management. Recommends returning to see him in ~4wks to aggressively manage DM to get controlled prior to considering another pregnancy.   Meds:  Meds ordered this encounter  Medications  . misoprostol (CYTOTEC) 200 MCG tablet    Sig: Place 4 tablets (800 mcg total) vaginally once for 1 dose. Repeat in 48 hours if needed    Dispense:  4 tablet    Refill:  1    Order Specific Question:   Supervising Provider    Answer:   Despina HiddenEURE, LUTHER H [[id number]]  . HYDROcodone-acetaminophen (NORCO/VICODIN) 5-325 MG tablet    Sig: Take 1-2 tablets by mouth every 6 (six) hours as needed for moderate pain or severe pain.    Dispense:  10 tablet    Refill:  0    Order Specific Question:   Supervising Provider    Answer:   Duane Lope H [[id number]]    Orders Placed This Encounter  Procedures  . Beta hCG quant (ref lab)    Return in about 1 week (around 10/02/2018) for F/U GYN w/ LHE; schedule pap Feb 2020.  Cheral Marker CNM, [facility] 09/25/2018 10:32 AM

## 2018-09-25 NOTE — Patient Instructions (Addendum)
Primary Care Providers  Dr. Dwana MelenaZack Hall Harmon([place]) [phone]  [facility] [phone]  [facility]([place]) Dr. Karilyn CotaGosrani [phone]  [facility]([place]) [phone]  [facility]([place]) [phone]  [facility] [phone]  [facility]. [facility] [phone]    FACTS YOU SHOULD KNOW  About Early Pregnancy Loss  WHAT IS AN EARLY PREGNANCY LOSS? Once the egg is fertilized with the sperm and begins to develop, it attaches to the lining of the uterus. This early pregnancy tissue may not develop into an embryo (the beginning stage of a baby). Sometimes an embryo does develop but does not continue to grow. These problems can be seen on ultrasound.  Many women experience the loss of a pregnancy during their lifetime.  As many as 10-30% of pregnancies end in pregnancy loss within the first trimester (or first 12-14 weeks).  MANAGEMNT OF EARLY PREGNANCY LOSS: There are 3 ways to care for an early pregnancy loss:   (1) Surgery, (2) Medicine, (3) Waiting for you to pass the pregnancy on your own. The decision as to how to proceed after being diagnosed with and early pregnancy loss is an individual one.  The decision can be made only after appropriate counseling.  You need to weigh the pros and cons of the 3 choices. Then you can make the choice that works for you.  SURGERY (D&E) . Procedure over in 1 day . Requires being put to sleep . Bleeding may be light . Possible problems during surgery, including injury to womb(uterus) . Care provider has more control Medicine (CYTOTEC) . The complete procedure may take days to weeks . No Surgery . Bleeding may be heavy at times . There may be drug side effects . Patient has more control Waiting . You may choose to wait, in which case your own body may complete the passing of the abnormal early pregnancy on its own in about 2-4 weeks . Your bleeding may be heavy at  times . There is a small possibility that you may need surgery if the bleeding is too much or not all of the pregnancy has passed.  CYTOTEC MANAGEMENT Prostaglandins (cytotec) are the most widely used drug for this purpose. They cause the uterus to cramp and contract. You will place the medicine yourself inside your vagina in the privacy of your home. Empting of the uterus should occur within 3 days but the process may continue for several weeks. The bleeding may seem heavy at times.  INSTRUCTIONS: Take all 4 tablets of cytotec (800mcg total) at one time. This will cause a lot of cramping, you may have bleeding, and pass tissue, then the cramping and bleeding should get better. If you do not pass the tissue, then you can take 4 more tablets of cytotec (800mcg total) 48 hours after your first dose.  You will come back to have your blood drawn to make sure the pregnancy hormones are dropping in 1 week. Please call us if you have any questions.   POSSIBLE SIDE EFFECTS FROM CYTOTEC . Nausea  Vomiting . Diarrhea Fever . Chills  Hot Flashes Side effects  from the process of the early pregnancy loss include: . Cramping  Bleeding . Headaches  Dizziness RISKS: This is a low risk procedure. Less than 1 in 100 women has a complication. An incomplete passage of the early pregnancy may occur. Also, hemorrhage (heavy bleeding) could happen.  Rarely the pregnancy will not be passed completely. Excessively heavy bleeding may  occur.  Your doctor may need to perform surgery to empty the uterus (D&E). Afterwards: Everybody will feel differently after the early pregnancy loss completion. You may have soreness or cramps for a day or two. You may have soreness or cramps for day or two.  You may have light bleeding for up to 2 weeks. You may be as active as you feel like being. If you have any of the following problems you may call [facility] at [phone] or M[facility] Admissions Unit at [phone] if it is after  hours. . If you have pain that does not get better with pain medication . Bleeding that soaks through 2 thick full-sized sanitary pads in an hour . Cramps that last longer than 2 days . Foul smelling discharge . Fever above 100.4 degrees F Even if you do not have any of these symptoms, you should have a follow-up exam to make sure you are healing properly. Your next normal period will usually start again in 4-6 week after the loss. You can get pregnant soon after the loss, so use birth control right away. Finally: Make sure all your questions are answered before during and after any procedure. Follow up with medical care and family planning methods.

## 2018-09-25 NOTE — Progress Notes (Signed)
US 9+4 wks single IUP,no fht,CRL 28.82 mm,normal ovaries bilat,GS 56.5 mm,discussed w/Kim

## 2018-09-26 LAB — BETA HCG QUANT (REF LAB): HCG QUANT: 3875 m[IU]/mL

## 2018-09-29 ENCOUNTER — Ambulatory Visit: Payer: BC Managed Care – PPO | Admitting: *Deleted

## 2018-10-06 ENCOUNTER — Encounter: Payer: Self-pay | Admitting: Obstetrics & Gynecology

## 2018-10-06 ENCOUNTER — Ambulatory Visit: Payer: BC Managed Care – PPO | Admitting: Obstetrics & Gynecology

## 2018-10-06 ENCOUNTER — Telehealth: Payer: Self-pay | Admitting: *Deleted

## 2018-10-06 ENCOUNTER — Other Ambulatory Visit: Payer: Self-pay | Admitting: Obstetrics & Gynecology

## 2018-10-06 VITALS — BP 121/83 | HR 91 | Ht 70.0 in | Wt 252.0 lb

## 2018-10-06 DIAGNOSIS — E1165 Type 2 diabetes mellitus with hyperglycemia: Secondary | ICD-10-CM | POA: Diagnosis not present

## 2018-10-06 MED ORDER — "INSULIN SYRINGE 27G X 1/2"" 1 ML MISC": 1.0000 | Freq: Every day | 11 refills | Status: AC

## 2018-10-06 MED ORDER — INSULIN DETEMIR 100 UNIT/ML ~~LOC~~ SOLN: 20.0000 [IU] | Freq: Every day | SUBCUTANEOUS | 11 refills | Status: AC

## 2018-10-06 MED ORDER — TERCONAZOLE 0.4 % VA CREA: 1.0000 | TOPICAL_CREAM | Freq: Every day | VAGINAL | 0 refills | Status: AC

## 2018-10-06 MED ORDER — INSULIN GLARGINE 100 UNIT/ML ~~LOC~~ SOLN: 20.0000 [IU] | Freq: Every day | SUBCUTANEOUS | 11 refills | Status: DC

## 2018-10-06 NOTE — Telephone Encounter (Signed)
ordered

## 2018-10-06 NOTE — Telephone Encounter (Signed)
Insurance will not cover Lantus but covers Levemir. Can you change please?

## 2018-10-06 NOTE — Progress Notes (Signed)
Patient ID: Natasha Flynn, female   DOB: [birth year], 37 y.o.   MRN: [id number]      Chief Complaint  Patient presents with  . Follow-up      37 y.o. G1P0010 No LMP recorded. The current method of family planning is none.  Outpatient Encounter Medications as of 10/06/2018  Medication Sig  . metFORMIN (GLUCOPHAGE) 1000 MG tablet Take 1 tablet (1,000 mg total) by mouth 2 (two) times daily with a meal.  . HYDROcodone-acetaminophen (NORCO/VICODIN) 5-325 MG tablet Take 1-2 tablets by mouth every 6 (six) hours as needed for moderate pain or severe pain. (Patient not taking: Reported on 10/06/2018)  . insulin glargine (LANTUS) 100 UNIT/ML injection Inject 0.2 mLs (20 Units total) into the skin daily.  . Insulin Syringe 27G X 1/2" 1 ML MISC 1 Syringe by Does not apply route at bedtime.  . misoprostol (CYTOTEC) 200 MCG tablet Place 4 tablets (800 mcg total) vaginally once for 1 dose. Repeat in 48 hours if needed  . Prenatal Vit-Fe Fumarate-FA (PRENATAL VITAMINS) 28-0.8 MG TABS Take by mouth.   No facility-administered encounter medications on file as of 10/06/2018.     Subjective Pt is s/p cytotec management of spontaneous pregnancy loss She states she experienced pregnancy loss the next day and has continued to have some bleeding ongoing although much less now We spent the majority of our time talking about her diabetes and management including oral and insulin therapy Most of that time spent talking regarding diet Recommended "The end of diabetes" by Dr Monico Hoar and she has already ordered it off the [facility] app Past Medical History:  Diagnosis Date  . B12 deficiency   . Seasonal allergies     Past Surgical History:  Procedure Laterality Date  . TONGUE SURGERY    . TONSILLECTOMY AND ADENOIDECTOMY  10/2005    OB History    Gravida  1   Para      Term      Preterm      AB  1   Living        SAB  1   TAB      Ectopic      Multiple      Live Births              Allergies  Allergen Reactions  . Sulfa Antibiotics Swelling    Social History   Socioeconomic History  . Marital status: Single    Spouse name: Rakeb Kibble  . Number of children: 0  . Years of education: Not on file  . Highest education level: Master's degree (e.g., MA, MS, MEng, MEd, MSW, MBA)  Occupational History  . Not on file  Social Needs  . Financial resource strain: Not very hard  . Food insecurity:    Worry: Never true    Inability: Never true  . Transportation needs:    Medical: No    Non-medical: No  Tobacco Use  . Smoking status: Never Smoker  . Smokeless tobacco: Never Used  Substance and Sexual Activity  . Alcohol use: Not Currently    Alcohol/week: 0.0 standard drinks  . Drug use: No  . Sexual activity: Yes    Birth control/protection: None  Lifestyle  . Physical activity:    Days per week: 2 days    Minutes per session: 30 min  . Stress: Only a little  Relationships  . Social connections:    Talks on phone: More than three times a week  Gets together: Once a week    Attends religious service: Never    Active member of club or organization: Yes    Attends meetings of clubs or organizations: More than 4 times per year    Relationship status: Married  Other Topics Concern  . Not on file  Social History Narrative  . Not on file    Family History  Problem Relation Age of Onset  . Hyperlipidemia Mother   . Diabetes Father   . Stroke Maternal Grandmother   . Diabetes Paternal Grandmother   . Dementia Paternal Grandmother   . Diabetes Paternal Uncle     Medications:       Current Outpatient Medications:  .  metFORMIN (GLUCOPHAGE) 1000 MG tablet, Take 1 tablet (1,000 mg total) by mouth 2 (two) times daily with a meal., Disp: 60 tablet, Rfl: 6 .  HYDROcodone-acetaminophen (NORCO/VICODIN) 5-325 MG tablet, Take 1-2 tablets by mouth every 6 (six) hours as needed for moderate pain or severe pain. (Patient not taking: Reported on 10/06/2018),  Disp: 10 tablet, Rfl: 0 .  insulin glargine (LANTUS) 100 UNIT/ML injection, Inject 0.2 mLs (20 Units total) into the skin daily., Disp: 10 mL, Rfl: 11 .  Insulin Syringe 27G X 1/2" 1 ML MISC, 1 Syringe by Does not apply route at bedtime., Disp: 100 each, Rfl: 11 .  misoprostol (CYTOTEC) 200 MCG tablet, Place 4 tablets (800 mcg total) vaginally once for 1 dose. Repeat in 48 hours if needed, Disp: 4 tablet, Rfl: 1 .  Prenatal Vit-Fe Fumarate-FA (PRENATAL VITAMINS) 28-0.8 MG TABS, Take by mouth., Disp: , Rfl:   Objective Blood pressure 121/83, pulse 91, height 5\' 10"  (1.778 m), weight 252 lb (114.3 kg), unknown if currently breastfeeding.    Pertinent ROS   Labs or studies     Impression Diagnoses this Encounter::   ICD-10-CM   1. Uncontrolled type 2 diabetes mellitus with hyperglycemia (HCC) E11.65     Established relevant diagnosis(es):   Plan/Recommendations: Meds ordered this encounter  Medications  . insulin glargine (LANTUS) 100 UNIT/ML injection    Sig: Inject 0.2 mLs (20 Units total) into the skin daily.    Dispense:  10 mL    Refill:  11  . Insulin Syringe 27G X 1/2" 1 ML MISC    Sig: 1 Syringe by Does not apply route at bedtime.    Dispense:  100 each    Refill:  11    Labs or Scans Ordered: No orders of the defined types were placed in this encounter.   Management:: >begin levemir 20 units at bedtime >follow up 2 wekks with [facility] data and will continue to manage from there  Follow up Return in about 2 weeks (around 10/20/2018) for Follow up, with Dr Despina HiddenEure.        Face to face time:  15 minutes  Greater than 50% of the visit time was spent in counseling and coordination of care with the patient.  The summary and outline of the counseling and care coordination is summarized in the note above.   All questions were answered.

## 2018-10-22 ENCOUNTER — Ambulatory Visit: Payer: BC Managed Care – PPO | Admitting: Obstetrics & Gynecology

## 2018-12-18 ENCOUNTER — Other Ambulatory Visit: Payer: BC Managed Care – PPO | Admitting: Women's Health

## 2020-07-22 IMAGING — US US OB COMP LESS 14 WK
1 series · 14 of 28 positions shown · non-contrast
Comparison: None.

CLINICAL DATA: Positive pregnancy test.  Dating.

EXAM:
OBSTETRIC <14 WK US AND TRANSVAGINAL OB US
TECHNIQUE: Both transabdominal and transvaginal ultrasound examinations were
performed for complete evaluation of the gestation as well as the
maternal uterus, adnexal regions, and pelvic cul-de-sac.
Transvaginal technique was performed to assess early pregnancy.

[Series 1: us ob comp less 14 wk · 0.28mm/px · 14 of 86 slices shown]
[im 4/86]
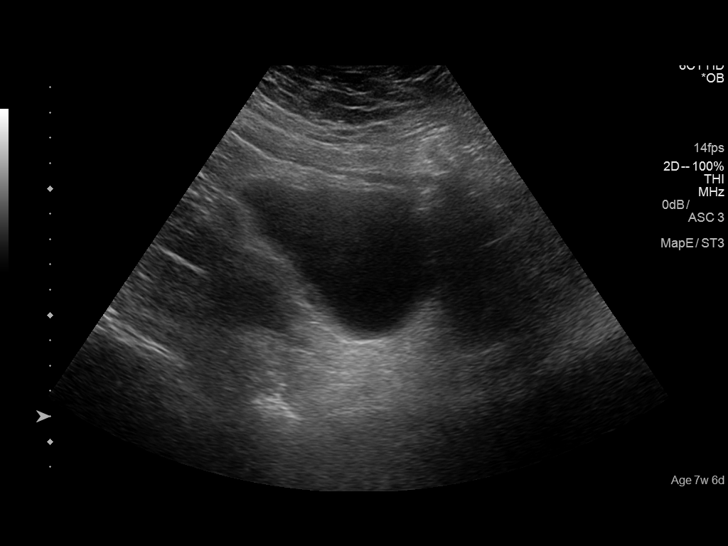
[im 10/86]
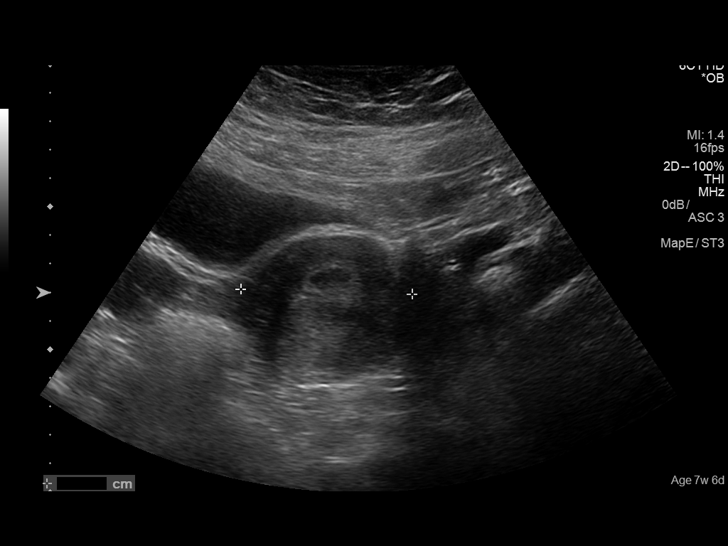
[im 16/86]
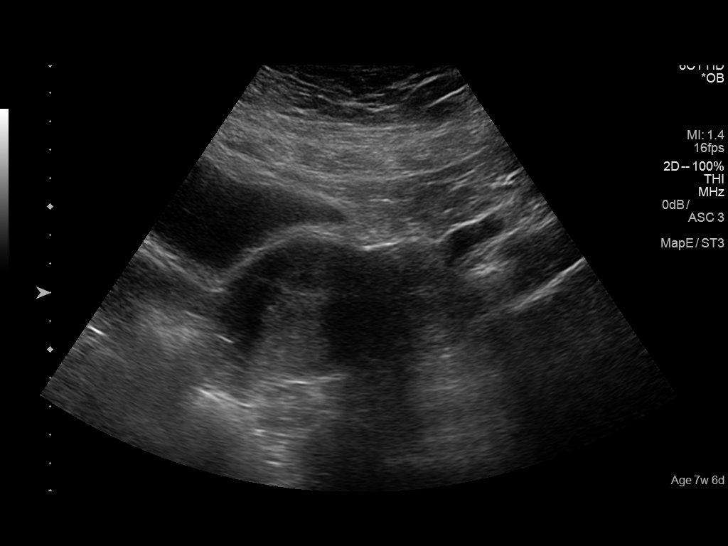
[im 23/86]
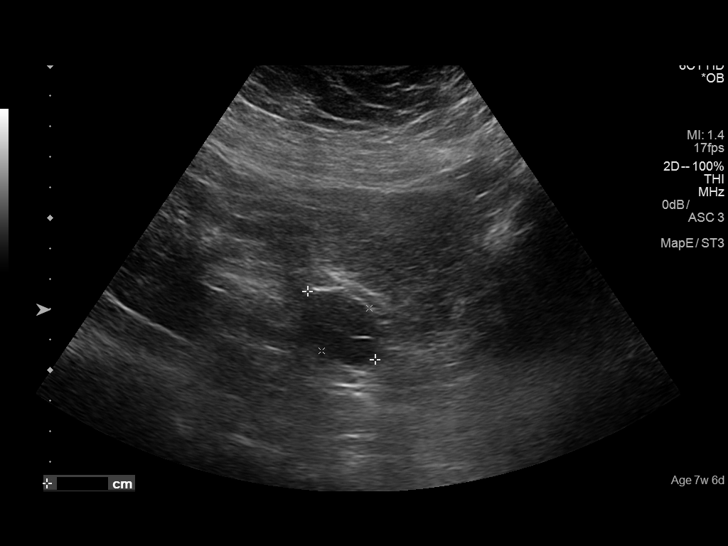
[im 29/86]
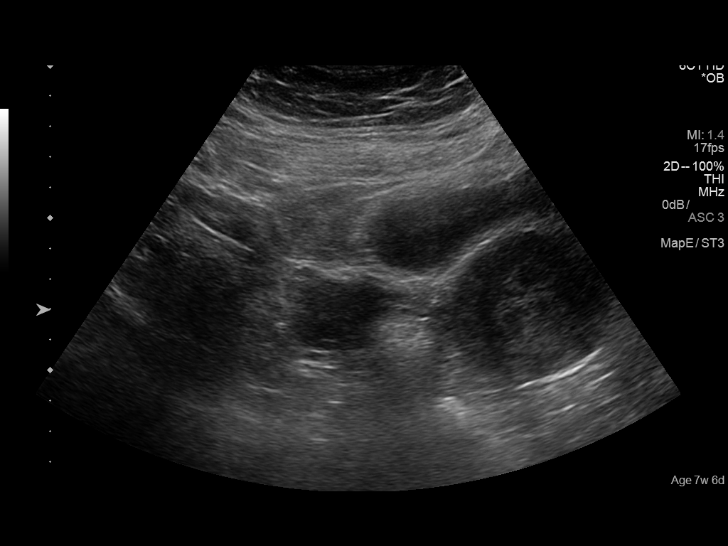
[im 35/86]
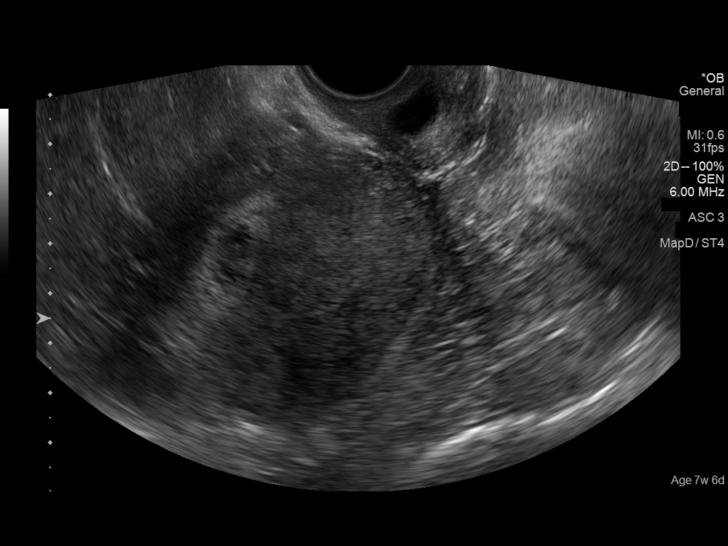
[im 41/86]
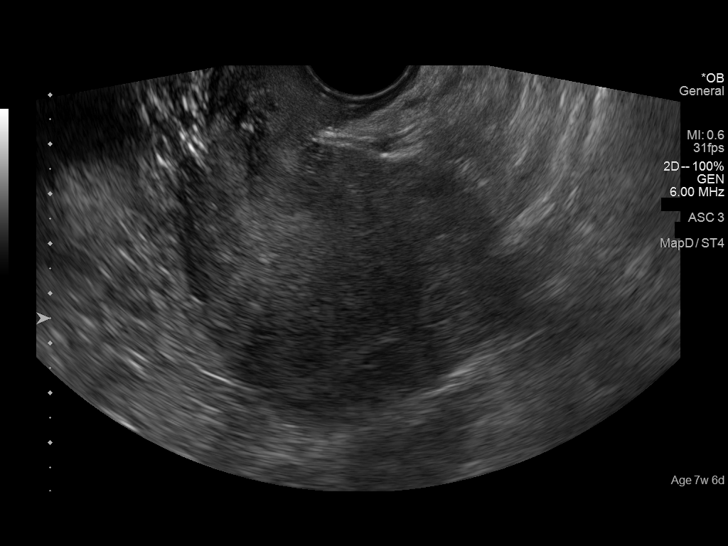
[im 48/86]
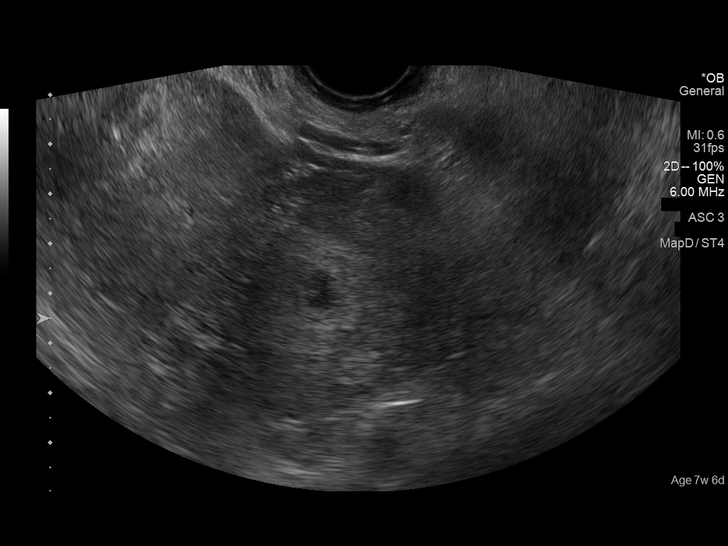
[im 54/86]
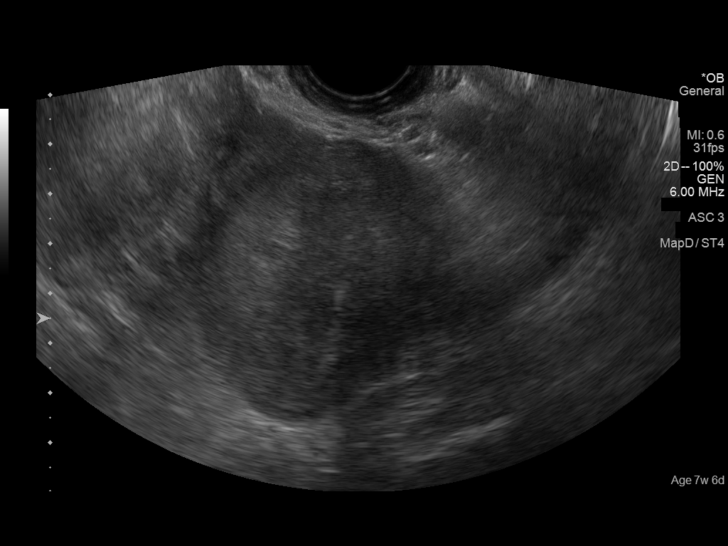
[im 60/86]
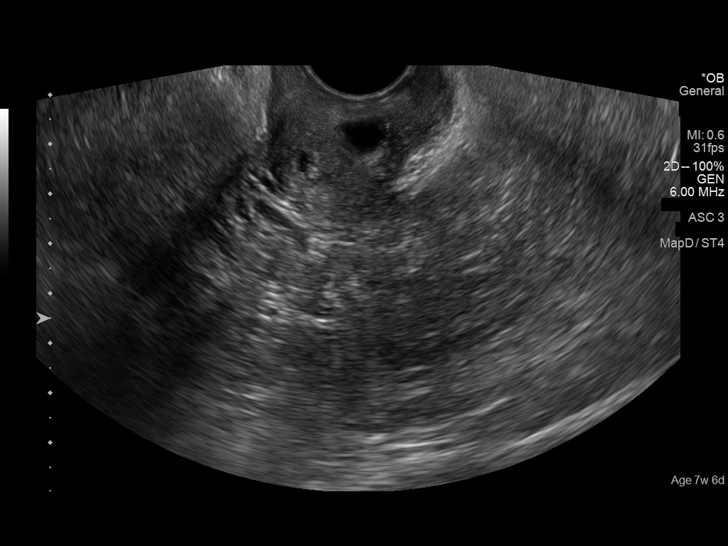
[im 67/86]
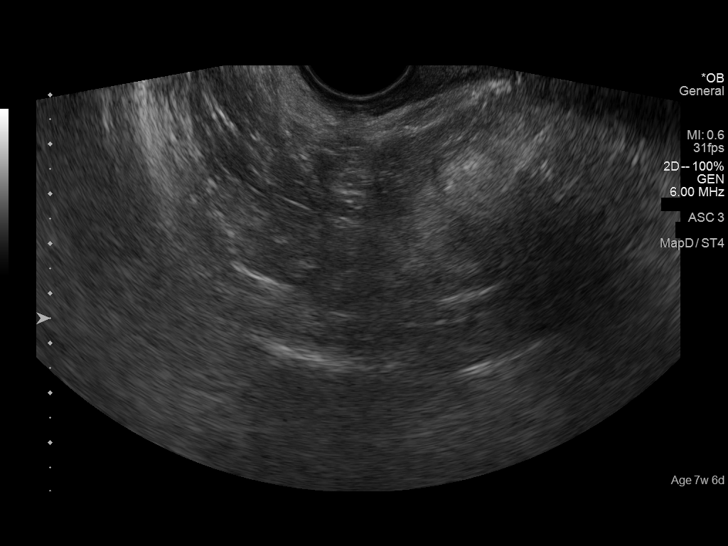
[im 73/86]
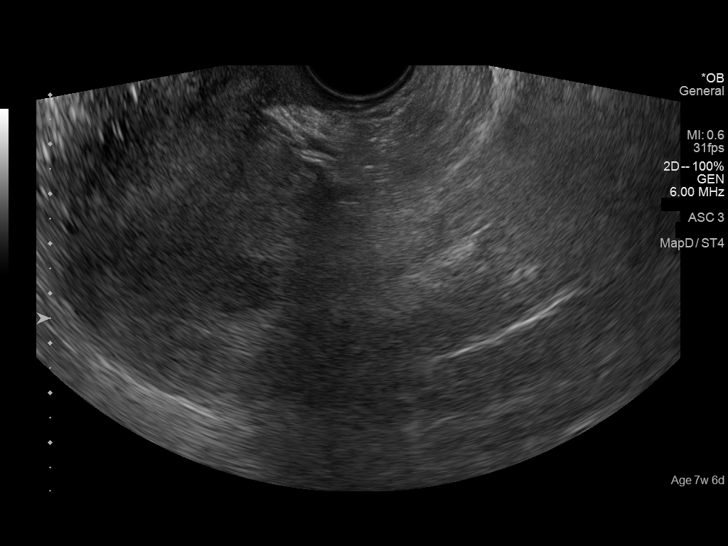
[im 79/86]
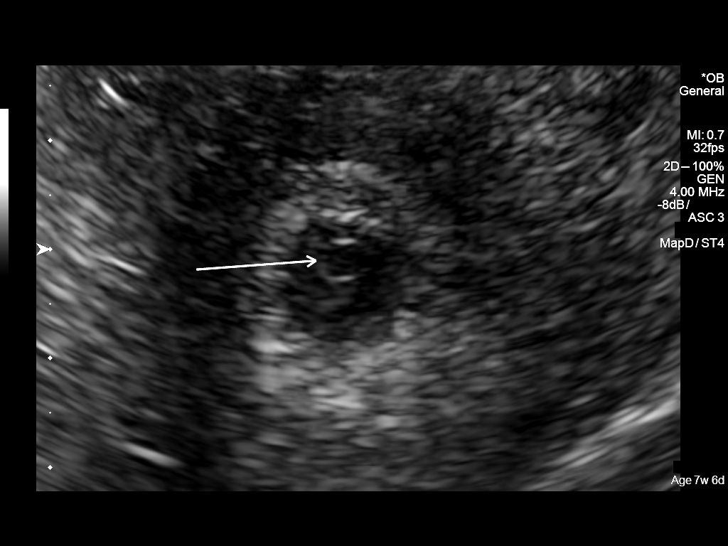
[im 86/86]
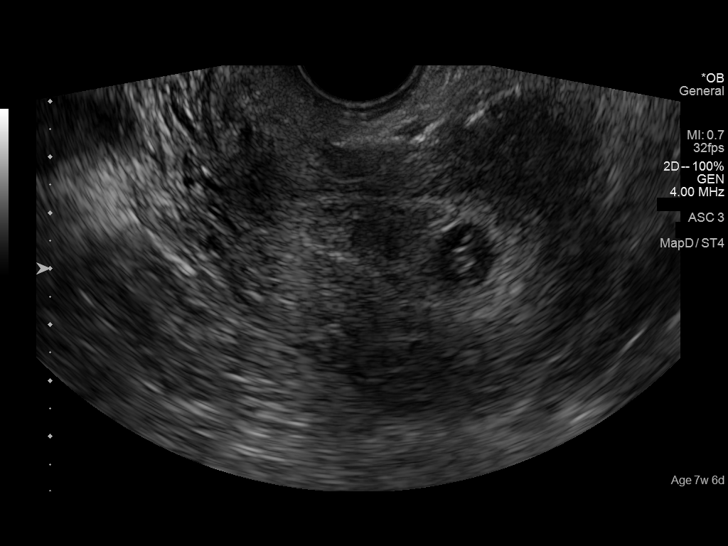

[14 of 28 positions shown; findings below may reference images not displayed]

FINDINGS: Intrauterine gestational sac: Single

Yolk sac:  Visualized.

Embryo:  Visualized.

Cardiac Activity: Visualized.

Heart Rate: 141 bpm

CRL:  7.4 mm   6 w   4 d                  US EDC: 04/05/2019

Subchorionic hemorrhage: None

Maternal uterus/adnexae: Normal right ovary, only visualized
transabdominally. Left ovary not visualized due to overlying bowel
gas. No free fluid in the pelvis.
IMPRESSION: Single live intrauterine gestation.  No subchorionic hemorrhage.
